# Patient Record
Sex: Female | Born: 1975
Health system: Southern US, Community
[De-identification: ages and names within clinical notes are randomized; demographics above are authoritative.]

## PROBLEM LIST (undated history)

## (undated) DIAGNOSIS — F329 Major depressive disorder, single episode, unspecified: Secondary | ICD-10-CM

## (undated) DIAGNOSIS — L509 Urticaria, unspecified: Secondary | ICD-10-CM

## (undated) DIAGNOSIS — F32A Depression, unspecified: Secondary | ICD-10-CM

## (undated) DIAGNOSIS — T7840XA Allergy, unspecified, initial encounter: Secondary | ICD-10-CM

## (undated) HISTORY — DX: Major depressive disorder, single episode, unspecified: F32.9

## (undated) HISTORY — DX: Depression, unspecified: F32.A

## (undated) HISTORY — PX: DILATION AND CURETTAGE OF UTERUS: SHX78

## (undated) HISTORY — DX: Allergy, unspecified, initial encounter: T78.40XA

## (undated) HISTORY — DX: Urticaria, unspecified: L50.9

---

## 2013-02-26 ENCOUNTER — Other Ambulatory Visit (HOSPITAL_COMMUNITY)
Admission: RE | Admit: 2013-02-26 | Discharge: 2013-02-26 | Disposition: A | Discharge status: Home / Self Care | Payer: Self-pay | Source: Ambulatory Visit | Attending: Women's Health | Admitting: Women's Health

## 2013-02-26 ENCOUNTER — Ambulatory Visit (INDEPENDENT_AMBULATORY_CARE_PROVIDER_SITE_OTHER): Payer: BC Managed Care – PPO | Admitting: Women's Health

## 2013-02-26 ENCOUNTER — Encounter: Payer: Self-pay | Admitting: Women's Health

## 2013-02-26 VITALS — BP 124/70 | Ht 64.5 in | Wt 162.0 lb

## 2013-02-26 DIAGNOSIS — N898 Other specified noninflammatory disorders of vagina: Secondary | ICD-10-CM

## 2013-02-26 DIAGNOSIS — Z113 Encounter for screening for infections with a predominantly sexual mode of transmission: Secondary | ICD-10-CM | POA: Insufficient documentation

## 2013-02-26 DIAGNOSIS — Z01419 Encounter for gynecological examination (general) (routine) without abnormal findings: Secondary | ICD-10-CM

## 2013-02-26 DIAGNOSIS — B9689 Other specified bacterial agents as the cause of diseases classified elsewhere: Secondary | ICD-10-CM

## 2013-02-26 DIAGNOSIS — N760 Acute vaginitis: Secondary | ICD-10-CM

## 2013-02-26 DIAGNOSIS — A499 Bacterial infection, unspecified: Secondary | ICD-10-CM

## 2013-02-26 LAB — WET PREP FOR TRICH, YEAST, CLUE: Trich, Wet Prep: NONE SEEN

## 2013-02-26 MED ORDER — METRONIDAZOLE 0.75 % VA GEL: VAGINAL | Status: DC

## 2013-02-26 NOTE — Patient Instructions (Signed)
Bacterial Vaginosis Bacterial vaginosis (BV) is a vaginal infection where the normal balance of bacteria in the vagina is disrupted. The normal balance is then replaced by an overgrowth of certain bacteria. There are several different kinds of bacteria that can cause BV. BV is the most common vaginal infection in women of childbearing age. CAUSES   The cause of BV is not fully understood. BV develops when there is an increase or imbalance of harmful bacteria.  Some activities or behaviors can upset the normal balance of bacteria in the vagina and put women at increased risk including:  Having a new sex partner or multiple sex partners.  Douching.  Using an intrauterine device (IUD) for contraception.  It is not clear what role sexual activity plays in the development of BV. However, women that have never had sexual intercourse are rarely infected with BV. Women do not get BV from toilet seats, bedding, swimming pools or from touching objects around them.  SYMPTOMS   Grey vaginal discharge.  A fish-like odor with discharge, especially after sexual intercourse.  Itching or burning of the vagina and vulva.  Burning or pain with urination.  Some women have no signs or symptoms at all. DIAGNOSIS  Your caregiver must examine the vagina for signs of BV. Your caregiver will perform lab tests and look at the sample of vaginal fluid through a microscope. They will look for bacteria and abnormal cells (clue cells), a pH test higher than 4.5, and a positive amine test all associated with BV.  RISKS AND COMPLICATIONS   Pelvic inflammatory disease (PID).  Infections following gynecology surgery.  Developing HIV.  Developing herpes virus. TREATMENT  Sometimes BV will clear up without treatment. However, all women with symptoms of BV should be treated to avoid complications, especially if gynecology surgery is planned. Female partners generally do not need to be treated. However, BV may spread  between female sex partners so treatment is helpful in preventing a recurrence of BV.   BV may be treated with antibiotics. The antibiotics come in either pill or vaginal cream forms. Either can be used with nonpregnant or pregnant women, but the recommended dosages differ. These antibiotics are not harmful to the baby.  BV can recur after treatment. If this happens, a second round of antibiotics will often be prescribed.  Treatment is important for pregnant women. If not treated, BV can cause a premature delivery, especially for a pregnant woman who had a premature birth in the past. All pregnant women who have symptoms of BV should be checked and treated.  For chronic reoccurrence of BV, treatment with a type of prescribed gel vaginally twice a week is helpful. HOME CARE INSTRUCTIONS   Finish all medication as directed by your caregiver.  Do not have sex until treatment is completed.  Tell your sexual partner that you have a vaginal infection. They should see their caregiver and be treated if they have problems, such as a mild rash or itching.  Practice safe sex. Use condoms. Only have 1 sex partner. PREVENTION  Basic prevention steps can help reduce the risk of upsetting the natural balance of bacteria in the vagina and developing BV:  Do not have sexual intercourse (be abstinent).  Do not douche.  Use all of the medicine prescribed for treatment of BV, even if the signs and symptoms go away.  Tell your sex partner if you have BV. That way, they can be treated, if needed, to prevent reoccurrence. SEEK MEDICAL CARE IF:     Your symptoms are not improving after 3 days of treatment.  You have increased discharge, pain, or fever. MAKE SURE YOU:   Understand these instructions.  Will watch your condition.  Will get help right away if you are not doing well or get worse. FOR MORE INFORMATION  Division of STD Prevention (DSTDP), Centers for Disease Control and Prevention:  www.cdc.gov/std American Social Health Association (ASHA): www.ashastd.org  Document Released: 09/09/2005 Document Revised: 12/02/2011 Document Reviewed: 03/02/2009 ExitCare Patient Information 2014 [employer].  

## 2013-02-26 NOTE — Progress Notes (Signed)
Monique Hurst 08-07-1976 [id number]    History:    New patient presents for annual exam with complaint of vaginal discharge. Had a negative STD screen in January, rare intercourse with husband, new partner one year. History of infertility using no contraception. Reports normal Pap history. Originally from [place].    Past medical history, past surgical history, family history and social history were all reviewed and documented in the EPIC chart. Moved here- husband working on PhD family counseling/ministry. 38 year old son. Geologist, engineering.  ROS:  A  ROS was performed and pertinent positives and negatives are included in the history.  Exam:  Filed Vitals:   02/26/13 1600  BP: 124/70    General appearance:  Normal Head/Neck:  Normal, without cervical or supraclavicular adenopathy. Thyroid:  Symmetrical, normal in size, without palpable masses or nodularity. Respiratory  Effort:  Normal  Auscultation:  Clear without wheezing or rhonchi Cardiovascular  Auscultation:  Regular rate, without rubs, murmurs or gallops  Edema/varicosities:  Not grossly evident Abdominal  Soft,nontender, without masses, guarding or rebound.  Liver/spleen:  No organomegaly noted  Hernia:  None appreciated  Skin  Inspection:  Grossly normal  Palpation:  Grossly normal Neurologic/psychiatric  Orientation:  Normal with appropriate conversation.  Mood/affect:  Normal  Genitourinary    Breasts: Examined lying and sitting.     Right: Without masses, retractions, discharge or axillary adenopathy.     Left: Without masses, retractions, discharge or axillary adenopathy.   Inguinal/mons:  Normal without inguinal adenopathy  External genitalia:  Normal  BUS/Urethra/Skene's glands:  Normal  Bladder:  Normal  Vagina:  Foul bloody discharge positive for amines, clues, and TNTC bacteria.  Cervix:  Normal  Uterus:  normal in size, shape and contour.  Midline and mobile  Adnexa/parametria:     Rt: Without masses  or tenderness.   Lt: Without masses or tenderness.  Anus and perineum: Normal  Digital rectal exam: Normal sphincter tone without palpated masses or tenderness  Assessment/Plan:  37 y.o. M. WF G2 P1  for annual exam.     Bacteria vaginosis  Plan: Encouraged to continue counseling due to marital issues. Pap, GC/Chlamydia added to Pap. SBE's, continue regular exercise/runner, calcium rich diet, MVI daily encouraged. Declines need for contraception. MetroGel vaginal cream 1 applicator at bedtime x5, alcohol precautions reviewed. Has also had some low abdominal discomfort, cramps if discomfort persists after treatment instructed to call back.   Harrington Challenger [facility], 4:34 PM 02/26/2013

## 2013-03-12 ENCOUNTER — Telehealth: Payer: Self-pay | Admitting: *Deleted

## 2013-03-12 NOTE — Telephone Encounter (Signed)
Pt informed with the below note. 

## 2013-03-12 NOTE — Telephone Encounter (Signed)
Pt was seen on 02/26/13 treated for BV infection, noticed that she is having frequent urination, very dark yellow urine with discomfort will using the restroom. OV? Please advise

## 2013-03-12 NOTE — Telephone Encounter (Signed)
Please call, best if she is seen for a UA. May be a UTI or a vaginal infection.

## 2013-03-15 ENCOUNTER — Encounter: Payer: Self-pay | Admitting: Gynecology

## 2013-03-15 ENCOUNTER — Ambulatory Visit (INDEPENDENT_AMBULATORY_CARE_PROVIDER_SITE_OTHER): Payer: BC Managed Care – PPO | Admitting: Women's Health

## 2013-03-15 ENCOUNTER — Encounter: Payer: Self-pay | Admitting: Women's Health

## 2013-03-15 DIAGNOSIS — R350 Frequency of micturition: Secondary | ICD-10-CM

## 2013-03-15 DIAGNOSIS — N390 Urinary tract infection, site not specified: Secondary | ICD-10-CM

## 2013-03-15 DIAGNOSIS — N949 Unspecified condition associated with female genital organs and menstrual cycle: Secondary | ICD-10-CM

## 2013-03-15 DIAGNOSIS — N9489 Other specified conditions associated with female genital organs and menstrual cycle: Secondary | ICD-10-CM

## 2013-03-15 LAB — URINALYSIS W MICROSCOPIC + REFLEX CULTURE
Protein, ur: NEGATIVE mg/dL
Urobilinogen, UA: 0.2 mg/dL (ref 0.0–1.0)

## 2013-03-15 LAB — WET PREP FOR TRICH, YEAST, CLUE
Clue Cells Wet Prep HPF POC: NONE SEEN
Trich, Wet Prep: NONE SEEN

## 2013-03-15 MED ORDER — SULFAMETHOXAZOLE-TRIMETHOPRIM 800-160 MG PO TABS: 1.0000 | ORAL_TABLET | Freq: Two times a day (BID) | ORAL | Status: DC

## 2013-03-15 MED ORDER — FLUCONAZOLE 150 MG PO TABS: 150.0000 mg | ORAL_TABLET | Freq: Once | ORAL | Status: DC

## 2013-03-15 NOTE — Patient Instructions (Addendum)
Asymptomatic Bacteriuria, Female Your urine study shows bacteria in your urine. You do not have the usual symptoms of burning or frequent urination. This is why it is called asymptomatic. You may need treatment with antibiotics. Treatment is especially important if you are pregnant. Sometimes this condition can progress to a more severe bladder or kidney infection. Symptoms include burning when urinating, back pain, fever, nausea, or vomiting. Take your antibiotics as directed. Finish them even if you start to feel better. Drink enough water and fluids to keep your urine clear or pale yellow. Go to the bathroom more frequently to keep your bladder empty. Keep the area around the vagina and rectum clean. Wipe yourself from front to back after urinating. Call your caregiver to arrange for follow-up care.  SEEK IMMEDIATE MEDICAL CARE IF:  You develop repeated vomiting.  You develop severe back or abdominal pain.  You have abnormal vaginal discharge or bleeding.  You have blood in the urine.  You develop cramping or abdominal pain.  You have a fever. If you are pregnant and develop any of the above problems see your caregiver or seek care immediately. Document Released: 09/09/2005 Document Revised: 12/02/2011 Document Reviewed: 07/26/2009 ExitCare Patient Information 2014 [employer].  

## 2013-03-15 NOTE — Progress Notes (Signed)
Patient ID: Monique Hurst, female   DOB: [birth year], 37 y.o.   MRN: [id number] Presents with complaint of increased urinary frequency with urgency, some pain and burning with urination. Questions if she still has bacteria vaginosis, slight vaginal burning. Has vague low abdominal/pelvic pressure  discomfort intermittently for the last 2 months. Denies fever, discharge. Monthly cycle.  Exam: Appears well, abdomen soft with no rebound or radiation of pain, no CVAT, external genitalia within normal limits, speculum exam scant discharge no erythema or odor noted, wet prep: negative. Bimanual no CMT or adnexal tenderness, mild tenderness in suprapubic area. UA: Small blood, small leukocytes, 21-50 WBCs, 11-20 rbc's.  UTI  Plan: Septra DS twice daily for 3 days #6, instructed to call if no relief of symptoms. If low abdominal pelvic pressure/discomfort continues will check ultrasound. Urine culture pending. Diflucan 150 times one dose given if vaginal itching occurs after antibiotic.

## 2013-03-16 ENCOUNTER — Telehealth: Payer: Self-pay | Admitting: *Deleted

## 2013-03-16 DIAGNOSIS — N949 Unspecified condition associated with female genital organs and menstrual cycle: Secondary | ICD-10-CM

## 2013-03-16 LAB — URINE CULTURE

## 2013-03-16 MED ORDER — FLUCONAZOLE 150 MG PO TABS: 150.0000 mg | ORAL_TABLET | Freq: Once | ORAL | Status: DC

## 2013-03-16 NOTE — Telephone Encounter (Signed)
Pt was seen yesterday and her diflucan tablet was sent to wrong pharmacy. It should have been sent to [facility]. I will sent rx there and call [facility] to cancel rx.

## 2013-03-17 ENCOUNTER — Telehealth: Payer: Self-pay | Admitting: *Deleted

## 2013-03-17 ENCOUNTER — Other Ambulatory Visit: Payer: Self-pay | Admitting: Women's Health

## 2013-03-17 DIAGNOSIS — R3129 Other microscopic hematuria: Secondary | ICD-10-CM

## 2013-03-17 NOTE — Telephone Encounter (Signed)
Pt was told her urine test was negative on 03/17/13 she is taking 3 days of Septra, no burning, but still having some stomach discomfort that makes her feel sick. Please advise

## 2013-03-17 NOTE — Telephone Encounter (Signed)
Left message on voicemail with the below.

## 2013-03-17 NOTE — Telephone Encounter (Signed)
Please call and have her schedule appointment with ultrasound after her next cycle.

## 2013-03-22 ENCOUNTER — Other Ambulatory Visit: Payer: Self-pay | Admitting: Women's Health

## 2013-03-22 DIAGNOSIS — M25559 Pain in unspecified hip: Secondary | ICD-10-CM

## 2013-04-02 ENCOUNTER — Other Ambulatory Visit: Payer: BC Managed Care – PPO

## 2013-04-02 ENCOUNTER — Ambulatory Visit: Payer: BC Managed Care – PPO | Admitting: Women's Health

## 2013-09-03 ENCOUNTER — Ambulatory Visit (INDEPENDENT_AMBULATORY_CARE_PROVIDER_SITE_OTHER): Payer: BC Managed Care – PPO | Admitting: Women's Health

## 2013-09-03 ENCOUNTER — Encounter: Payer: Self-pay | Admitting: Women's Health

## 2013-09-03 ENCOUNTER — Telehealth: Payer: Self-pay | Admitting: *Deleted

## 2013-09-03 DIAGNOSIS — A499 Bacterial infection, unspecified: Secondary | ICD-10-CM

## 2013-09-03 DIAGNOSIS — N949 Unspecified condition associated with female genital organs and menstrual cycle: Secondary | ICD-10-CM

## 2013-09-03 DIAGNOSIS — N9489 Other specified conditions associated with female genital organs and menstrual cycle: Secondary | ICD-10-CM

## 2013-09-03 DIAGNOSIS — N898 Other specified noninflammatory disorders of vagina: Secondary | ICD-10-CM

## 2013-09-03 DIAGNOSIS — B9689 Other specified bacterial agents as the cause of diseases classified elsewhere: Secondary | ICD-10-CM

## 2013-09-03 DIAGNOSIS — N760 Acute vaginitis: Secondary | ICD-10-CM

## 2013-09-03 LAB — WET PREP FOR TRICH, YEAST, CLUE: Trich, Wet Prep: NONE SEEN

## 2013-09-03 MED ORDER — FLUCONAZOLE 150 MG PO TABS: ORAL_TABLET | ORAL | Status: DC

## 2013-09-03 MED ORDER — METRONIDAZOLE 0.75 % VA GEL: VAGINAL | Status: DC

## 2013-09-03 NOTE — Telephone Encounter (Signed)
Pt informed with the below note, she will be in at 4:30 am

## 2013-09-03 NOTE — Progress Notes (Signed)
Patient ID: Monique Hurst, female   DOB: Oct 15, 1975, 37 y.o.   MRN: [id number] Presents with complaint of vaginal swelling and discharge with irritation and itching. Monthly cycle/condoms/same partner. Has had several bacterial and yeast infections this past year. Denies urinary symptoms.  Exam: Appears well. External genitalia extremely erythematous, speculum exam moderate amount of adherent white discharge with no odor noted, vaginal walls are erythematous. Wet prep positive for yeast, clues, and TNTC bacteria.  Bacteria vaginosis and Yeast  Plan: MetroGel vaginal cream 1 applicator at bedtime x5, alcohol precautions reviewed. Diflucan 150 by mouth today and repeat in 3 days. Instructed to call if no relief of symptoms. Boric acid to TWICE weekly vaginally after symptoms resolve. Instructed to call if  symptoms return.

## 2013-09-03 NOTE — Telephone Encounter (Signed)
4;30 pm

## 2013-09-03 NOTE — Patient Instructions (Signed)
metrogel vaginally for 5 nights,  Diflucan today and repeat in 3 days.  Boric acid gelcaps twice weekly after symptoms resolve Bacterial Vaginosis Bacterial vaginosis is an infection of the vagina. A healthy vagina has many kinds of good germs (bacteria). Sometimes the number of good germs can change. This allows bad germs to move in and cause an infection. You may be given medicine (antibiotics) to treat the infection. Or, you may not need treatment at all. HOME CARE  Take your medicine as told. Finish them even if you start to feel better.  Do not have sex until you finish your medicine.  Do not douche.  Practice safe sex.  Tell your sex partner that you have an infection. They should see their doctor for treatment if they have problems. GET HELP RIGHT AWAY IF:  You do not get better after 3 days of treatment.  You have grey fluid (discharge) coming from your vagina.  You have pain.  You have a temperature of 102 F (38.9 C) or higher. MAKE SURE YOU:   Understand these instructions.  Will watch your condition.  Will get help right away if you are not doing well or get worse. Document Released: 06/18/2008 Document Revised: 12/02/2011 Document Reviewed: 04/21/2013 [facility] Patient Information 2014 [place], [place].

## 2013-09-03 NOTE — Telephone Encounter (Signed)
OV

## 2013-09-03 NOTE — Telephone Encounter (Signed)
Pt calling c/o BV infection irritation and some odor yellowish discharge since last weekend. Pt has been unfaithful with husband but states they have not had intercourse in 7 month. Is not using condoms with current partner and using sex toys. I offered OV, but patient lives in [place] unable come today at times given by front desk. Pt call back # [phone]  Please advise

## 2014-01-04 ENCOUNTER — Other Ambulatory Visit: Payer: Self-pay

## 2014-01-04 DIAGNOSIS — B9689 Other specified bacterial agents as the cause of diseases classified elsewhere: Secondary | ICD-10-CM

## 2014-01-04 DIAGNOSIS — N760 Acute vaginitis: Principal | ICD-10-CM

## 2014-04-07 ENCOUNTER — Ambulatory Visit (INDEPENDENT_AMBULATORY_CARE_PROVIDER_SITE_OTHER): Payer: BC Managed Care – PPO | Admitting: Women's Health

## 2014-04-07 ENCOUNTER — Encounter: Payer: Self-pay | Admitting: Women's Health

## 2014-04-07 DIAGNOSIS — B373 Candidiasis of vulva and vagina: Secondary | ICD-10-CM

## 2014-04-07 DIAGNOSIS — R350 Frequency of micturition: Secondary | ICD-10-CM

## 2014-04-07 DIAGNOSIS — B3731 Acute candidiasis of vulva and vagina: Secondary | ICD-10-CM

## 2014-04-07 LAB — URINALYSIS W MICROSCOPIC + REFLEX CULTURE
Bilirubin Urine: NEGATIVE
CASTS: NONE SEEN
Crystals: NONE SEEN
GLUCOSE, UA: NEGATIVE mg/dL
HGB URINE DIPSTICK: NEGATIVE
Nitrite: NEGATIVE
PH: 7 (ref 5.0–8.0)
Protein, ur: NEGATIVE mg/dL
RBC / HPF: NONE SEEN RBC/hpf (ref <3)
Specific Gravity, Urine: 1.015 (ref 1.005–1.030)
Urobilinogen, UA: 0.2 mg/dL (ref 0.0–1.0)

## 2014-04-07 LAB — WET PREP FOR TRICH, YEAST, CLUE
Clue Cells Wet Prep HPF POC: NONE SEEN
Trich, Wet Prep: NONE SEEN

## 2014-04-07 MED ORDER — FLUCONAZOLE 150 MG PO TABS: 150.0000 mg | ORAL_TABLET | Freq: Once | ORAL | Status: DC

## 2014-04-07 NOTE — Patient Instructions (Signed)

## 2014-04-07 NOTE — Progress Notes (Signed)
Patient ID: Monique Hurst, female   DOB: 21-May-1976, 38 y.o.   MRN: [id number] Presents with complaint of vaginal irritation with itching for the last several days. Also reviewed labs 03/17/14 that she had a primary care. UA- treated for a UTI  with Cipro .CBC, thyroid panel, comprehensive metabolic panel all normal.  Exam: Appears well. External genitalia extremely erythematous, speculum exam moderate amount curdy  white discharge, wet prep positive for yeast.  Yeast vaginitis  Plan: Diflucan 1:50 po x1 days with 1 refill. UA trace leukocytes, culture pending for test of cure. Yeast and UTI prevention discussed will call if no relief of symptoms. Keep scheduled annual exam in August.

## 2014-04-08 LAB — URINE CULTURE: Colony Count: 2000

## 2014-04-11 ENCOUNTER — Telehealth: Payer: Self-pay | Admitting: *Deleted

## 2014-04-11 NOTE — Telephone Encounter (Signed)
Pt was seen on 04/07/14 treated for yeast infection took 1 dose of diflucan 150 mg. Still having symptoms, pt had extra refill never took , will take and follow up as needed.

## 2014-04-28 ENCOUNTER — Encounter: Payer: Self-pay | Admitting: Women's Health

## 2014-04-28 ENCOUNTER — Ambulatory Visit (INDEPENDENT_AMBULATORY_CARE_PROVIDER_SITE_OTHER): Payer: BC Managed Care – PPO | Admitting: Women's Health

## 2014-04-28 ENCOUNTER — Other Ambulatory Visit (HOSPITAL_COMMUNITY)
Admission: RE | Admit: 2014-04-28 | Discharge: 2014-04-28 | Disposition: A | Discharge status: Home / Self Care | Payer: BC Managed Care – PPO | Source: Ambulatory Visit | Attending: Gynecology | Admitting: Gynecology

## 2014-04-28 VITALS — BP 120/80 | Ht 65.5 in | Wt 179.8 lb

## 2014-04-28 DIAGNOSIS — E079 Disorder of thyroid, unspecified: Secondary | ICD-10-CM

## 2014-04-28 DIAGNOSIS — Z13220 Encounter for screening for lipoid disorders: Secondary | ICD-10-CM

## 2014-04-28 DIAGNOSIS — Z01419 Encounter for gynecological examination (general) (routine) without abnormal findings: Secondary | ICD-10-CM

## 2014-04-28 DIAGNOSIS — Z833 Family history of diabetes mellitus: Secondary | ICD-10-CM

## 2014-04-28 NOTE — Progress Notes (Signed)
Monique Hurst 1976/07/30 [id number]    History:    Presents for annual exam.  Regular monthly cycles/vasectomy. Normal Pap history.  Past medical history, past surgical history, family history and social history were all reviewed and documented in the EPIC chart. Paralegal, , working as a Control and instrumentation engineer. Has a 39 year old son doing well. Originally from [place].  ROS:  A  12 point ROS was performed and pertinent positives and negatives are included.  Exam:  Filed Vitals:   04/28/14 1023  BP: 120/80    General appearance:  Normal Thyroid:  Symmetrical, normal in size, without palpable masses or nodularity. Respiratory  Auscultation:  Clear without wheezing or rhonchi Cardiovascular  Auscultation:  Regular rate, without rubs, murmurs or gallops  Edema/varicosities:  Not grossly evident Abdominal  Soft,nontender, without masses, guarding or rebound.  Liver/spleen:  No organomegaly noted  Hernia:  None appreciated  Skin  Inspection:  Grossly normal   Breasts: Examined lying and sitting.     Right: Without masses, retractions, discharge or axillary adenopathy.     Left: Without masses, retractions, discharge or axillary adenopathy. Gentitourinary   Inguinal/mons:  Normal without inguinal adenopathy  External genitalia:  Normal  BUS/Urethra/Skene's glands:  Normal  Vagina:  Normal  Cervix:  Normal  Uterus:   normal in size, shape and contour.  Midline and mobile  Adnexa/parametria:     Rt: Without masses or tenderness.   Lt: Without masses or tenderness.  Anus and perineum: Normal  Digital rectal exam: Normal sphincter tone without palpated masses or tenderness  Assessment/Plan:  38 y.o.MWF G1P1  for annual exam with no complaints.  Normal GYN exam/vasectomy  Plan SBE's, annual mammogram at 39, continue regular exercise, calcium rich diet, decrease calories for weight loss. CBC, lipid panel, glucose, UA, Pap Pap normal 2014 absent endocervical cells.   Note:  This dictation was prepared with Dragon/digital dictation.  Any transcriptional errors that result are unintentional. Huel Cote [facility], 1:53 PM 04/28/2014

## 2014-04-28 NOTE — Patient Instructions (Signed)

## 2014-04-29 LAB — URINALYSIS W MICROSCOPIC + REFLEX CULTURE
BILIRUBIN URINE: NEGATIVE
CRYSTALS: NONE SEEN
Casts: NONE SEEN
GLUCOSE, UA: NEGATIVE mg/dL
Hgb urine dipstick: NEGATIVE
Ketones, ur: NEGATIVE mg/dL
Leukocytes, UA: NEGATIVE
Nitrite: NEGATIVE
Protein, ur: NEGATIVE mg/dL
SPECIFIC GRAVITY, URINE: 1.018 (ref 1.005–1.030)
UROBILINOGEN UA: 0.2 mg/dL (ref 0.0–1.0)
pH: 6 (ref 5.0–8.0)

## 2014-05-02 LAB — CYTOLOGY - PAP

## 2014-07-25 ENCOUNTER — Encounter: Payer: Self-pay | Admitting: Women's Health

## 2014-11-21 ENCOUNTER — Ambulatory Visit (INDEPENDENT_AMBULATORY_CARE_PROVIDER_SITE_OTHER): Payer: BLUE CROSS/BLUE SHIELD | Admitting: Gynecology

## 2014-11-21 ENCOUNTER — Encounter: Payer: Self-pay | Admitting: Gynecology

## 2014-11-21 VITALS — BP 116/74

## 2014-11-21 DIAGNOSIS — N946 Dysmenorrhea, unspecified: Secondary | ICD-10-CM

## 2014-11-21 DIAGNOSIS — N898 Other specified noninflammatory disorders of vagina: Secondary | ICD-10-CM

## 2014-11-21 LAB — WET PREP FOR TRICH, YEAST, CLUE
CLUE CELLS WET PREP: NONE SEEN
TRICH WET PREP: NONE SEEN

## 2014-11-21 MED ORDER — BETAMETHASONE DIPROPIONATE 0.05 % EX CREA: TOPICAL_CREAM | Freq: Two times a day (BID) | CUTANEOUS | Status: DC

## 2014-11-21 MED ORDER — FLUCONAZOLE 200 MG PO TABS: 200.0000 mg | ORAL_TABLET | Freq: Every day | ORAL | Status: DC

## 2014-11-21 NOTE — Patient Instructions (Signed)
Follow up for the ultrasound as scheduled.  Take the Diflucan pill daily for 5 days  Apply the steroid cream externally twice daily.  Follow up if symptoms persist or worsen.

## 2014-11-21 NOTE — Progress Notes (Signed)
Monique Hurst 03/17/1976 [id number]        39 y.o.  G2P0011 presents with onset of vaginal irritation 5 days ago. Slight discharge. Had bikini waxing and subsequent intercourse. Now with significant irritation. Mild discharge. No odor. No fever chills nausea vomiting diarrhea constipation. No frequency dysuria or urgency. Patient also complaining of worsening dysmenorrhea with each menses. Notes starts right before her menses and lasts for several days. Totally resolves in between. Notes some deep dyspareunia but not consistent. Regular menses without breakthrough bleeding. Vasectomy birth control.  Past medical history,surgical history, problem list, medications, allergies, family history and social history were all reviewed and documented in the EPIC chart.  Directed ROS with pertinent positives and negatives documented in the history of present illness/assessment and plan.  Exam:  Kim assistant General appearance:  Normal Abdomen soft nontender without masses guarding rebound Pelvic external BUS vagina with intense symmetrical irritative vulvitis from periclitoral region to perianal region. Heavy white discharge. Cervix normal. Uterus normal size midline mobile nontender. Adnexa without masses or tenderness.  Assessment/Plan:  39 y.o. G2P0011 with intense vulvitis. Wet prep is positive for yeast.  I suspect she may have also had an allergic reaction to the waxing as it seems to be a more intense response than normal yeast. Will cover with Diflucan 200 mg daily 5 days. Diprolene 0.05% cream externally twice a day. Follow up if symptoms persist or worsen. Also schedule ultrasound due to her dysmenorrhea. Differential to include endometriosis reviewed. Possible laparoscopy also discussed. Options to include nonsteroidal anti-inflammatory perimenstrual discussed. Will further review after ultrasound.     Anastasio Auerbach MD, 10:14 AM 11/21/2014

## 2014-11-30 ENCOUNTER — Other Ambulatory Visit: Payer: Self-pay | Admitting: Gynecology

## 2014-11-30 ENCOUNTER — Ambulatory Visit (INDEPENDENT_AMBULATORY_CARE_PROVIDER_SITE_OTHER): Payer: BLUE CROSS/BLUE SHIELD | Admitting: Gynecology

## 2014-11-30 ENCOUNTER — Telehealth: Payer: Self-pay | Admitting: *Deleted

## 2014-11-30 ENCOUNTER — Ambulatory Visit (INDEPENDENT_AMBULATORY_CARE_PROVIDER_SITE_OTHER): Payer: BLUE CROSS/BLUE SHIELD

## 2014-11-30 ENCOUNTER — Encounter: Payer: Self-pay | Admitting: Gynecology

## 2014-11-30 VITALS — BP 112/66

## 2014-11-30 DIAGNOSIS — N946 Dysmenorrhea, unspecified: Secondary | ICD-10-CM | POA: Diagnosis not present

## 2014-11-30 DIAGNOSIS — K625 Hemorrhage of anus and rectum: Secondary | ICD-10-CM

## 2014-11-30 DIAGNOSIS — D251 Intramural leiomyoma of uterus: Secondary | ICD-10-CM

## 2014-11-30 MED ORDER — IBUPROFEN 800 MG PO TABS: 800.0000 mg | ORAL_TABLET | Freq: Three times a day (TID) | ORAL | Status: AC | PRN

## 2014-11-30 NOTE — Telephone Encounter (Signed)
Referral placed they will contact pt to schedule. 

## 2014-11-30 NOTE — Telephone Encounter (Signed)
-----   Message from Anastasio Auerbach, MD sent at 11/30/2014 10:54 AM EST ----- Schedule an appointment with [facility] reference new onset rectal bleeding

## 2014-11-30 NOTE — Progress Notes (Signed)
Monique Hurst April 23, 1976 [id number]        39 y.o.  G2P0011 presents for ultrasound due to worsening dysmenorrhea and intermittent dyspareunia. Exam was normal. Did mention to the ultrasound technician that she was having intermittent rectal bleeding. No diarrhea constipation lower abdominal pain.  Past medical history,surgical history, problem list, medications, allergies, family history and social history were all reviewed and documented in the EPIC chart.  Directed ROS with pertinent positives and negatives documented in the history of present illness/assessment and plan.  Ultrasound shows uterus normal size with 2 small myomas 31 mm and 32 mm. Endometrial echo 8.2 mm. Right and left ovaries normal with physiologic changes. Cul-de-sac negative.  Assessment/Plan:  39 y.o. G2P0011 with worsening dysmenorrhea. Intermittent deep dyspareunia but not consistent. Ultrasound is normal. Reviewed with the patient options to include laparoscopy now to rule out endometriosis versus trial of nonsteroidal anti-inflammatory such as ibuprofen 800 mg every 8 hours earliest onset of cramping times several days #60 refill 1. Patient will try the ibuprofen. She'll follow up if she has worsening dysmenorrhea or other symptoms and she wants to proceed with laparoscopy. I reviewed the history of her rectal bleeding. Recommended follow up with gastroenterology and we will help to arrange this appointment and she knows the importance of follow up and to expect our phone call to arrange.     Anastasio Auerbach MD, 10:57 AM 11/30/2014

## 2014-11-30 NOTE — Patient Instructions (Signed)
Office will contact you to arrange the gastroenterology appointment.  Follow up with me if you are interested in pursuing the laparoscopy.

## 2014-12-01 ENCOUNTER — Encounter: Payer: Self-pay | Admitting: Physician Assistant

## 2014-12-02 NOTE — Telephone Encounter (Signed)
appointment 12/14/14 @ 8:30am with Dr.Esterwood.

## 2014-12-14 ENCOUNTER — Ambulatory Visit: Payer: Self-pay | Admitting: Physician Assistant

## 2014-12-27 ENCOUNTER — Ambulatory Visit (INDEPENDENT_AMBULATORY_CARE_PROVIDER_SITE_OTHER): Payer: BLUE CROSS/BLUE SHIELD | Admitting: Physician Assistant

## 2014-12-27 ENCOUNTER — Telehealth: Payer: Self-pay | Admitting: *Deleted

## 2014-12-27 ENCOUNTER — Encounter: Payer: Self-pay | Admitting: Physician Assistant

## 2014-12-27 VITALS — BP 120/80 | HR 72 | Ht 66.0 in | Wt 181.0 lb

## 2014-12-27 DIAGNOSIS — K625 Hemorrhage of anus and rectum: Secondary | ICD-10-CM | POA: Diagnosis not present

## 2014-12-27 DIAGNOSIS — R1030 Lower abdominal pain, unspecified: Secondary | ICD-10-CM | POA: Diagnosis not present

## 2014-12-27 DIAGNOSIS — Z800 Family history of malignant neoplasm of digestive organs: Secondary | ICD-10-CM | POA: Diagnosis not present

## 2014-12-27 MED ORDER — MOVIPREP 100 G PO SOLR: 1.0000 | ORAL | Status: DC

## 2014-12-27 NOTE — Patient Instructions (Signed)
You have been scheduled for a colonoscopy. Please follow written instructions given to you at your visit today.  Please pick up your prep supplies at the pharmacy within the next 1-3 days. [place], [place], [place]. If you use inhalers (even only as needed), please bring them with you on the day of your procedure. Your physician has requested that you go to www.startemmi.com and enter the access code given to you at your visit today. This web site gives a general overview about your procedure. However, you should still follow specific instructions given to you by our office regarding your preparation for the procedure.

## 2014-12-27 NOTE — Telephone Encounter (Signed)
Received lab results from Dr. Nyra Capes, [place] per our request from signed release.  Gave to Amy Esterwood PA-C for review.

## 2014-12-27 NOTE — Progress Notes (Signed)
Patient ID: Monique Hurst, female   DOB: [birth year], 39 y.o.   MRN: [id number]   Subjective:    Patient ID: Monique Hurst, female    DOB: Sep 19, 1976, 39 y.o.   MRN: [id number]  HPI Monique Hurst is a 39 year old female new to GI, referred by Dr. Phineas Real for evaluation of rectal bleeding. Patient has undergone GYN evaluation for pelvic pain, dysmenorrhea and dyspareunia and per Dr. Zelphia Cairo notes may have endometriosis. She has not had laparoscopy. Patient states that she's been having her current symptoms for several months but towards the end of February had a 2 to three-week episode of rectal bleeding with bright red blood noted with every bowel movement. She says there was blood in the commode and mixed with her bowel movements and some very tiny clots. She says this was associated with lower abdominal cramping and it was hard for her to differentiate this discomfort from her menstrual pain. She believes that this episode happened around the time of flight menstrual cycle however is not certain. Bleeding stopped and then she had recurrence last week over 2 days with similar bright red blood with bowel movements. She shows she feels bloated full and uncomfortable in her lower abdomen all the time "like there is a fist in her lower abdomen". She's not had any change in her bowel habits other than perhaps looser stools appetite is been fine and weight has been stable and no upper GI symptoms. She's been taking ibuprofen periodically no not on a regular basis. No prior abdominal surgery no prior GI evaluation. Sister was just diagnosed with some sort of "intestinal cancer" at age 53 and is stage II -she says they are not  very close, is not exactly sure what she has. Paternal grandfather died from colon cancer.  Review of Systems Pertinent positive and negative review of systems were noted in the above HPI section.  All other review of systems was otherwise negative.  Outpatient Encounter Prescriptions as of  12/27/2014  Medication Sig  . ibuprofen (ADVIL,MOTRIN) 800 MG tablet Take 1 tablet (800 mg total) by mouth every 8 (eight) hours as needed.  Marland Kitchen PRESCRIPTION MEDICATION Rx for allergies  ? name  . MOVIPREP 100 G SOLR Take 1 kit (200 g total) by mouth as directed.   No Known Allergies Patient Active Problem List   Diagnosis Date Noted  . BV (bacterial vaginosis) 02/26/2013   History   Social History  . Marital Status: Married    Spouse Name: N/A  . Number of Children: N/A  . Years of Education: N/A   Occupational History  . Not on file.   Social History Main Topics  . Smoking status: Former Research scientist (life sciences)  . Smokeless tobacco: Never Used  . Alcohol Use: Yes     Comment: BEER  . Drug Use: No  . Sexual Activity: Yes   Other Topics Concern  . Not on file   Social History Narrative    Ms. Seward's family history includes Hypertension in her maternal grandmother.      Objective:    Filed Vitals:   12/27/14 1038  BP: 120/80  Pulse: 72    Physical Exam   well-developed young female in no acute distress blood pressure 120/80 pulse 72 height 5 foot 6 weight 181, BMI 29. HEENT ;nontraumatic normocephalic EOMI PERRLA sclera anicteric, Supple, Cardiovascular; regular rate and rhythm with S1-S2 no murmur or gallop, Pulmonary ;clear bilaterally, Abdomen; soft she has mild tenderness in the suprapubic and right lower quadrant no rebound  no palpable mass or hepatosplenomegaly, bowel sounds are present, Rectal; exam not done this was recently done by her PCP with finding of external hemorrhoid, Ext; is no clubbing cyanosis or edema skin warm and dry, Psych; mood and affect appropriate       Assessment & Plan:   #1 39 yo female with several month hx of lower abdominal pain, and 2 separate episodes of BRB per rectum -the first lasting for 2 weeks . Also has dysmenorrhea, dyspareunia. R/O occult colon lesion R/O possible endometriosis with bowel involvement #2 Family hx of colon cancer  -sibling  Plan; will obtain recent labs from her PCP Schedule for Colonoscopy with Dr. Scarlette Shorts. Procedure was discussed in detail with patient and she is agreeable to proceed Further workup pending results of above     Monique Hurst S Livian Vanderbeck PA-C 12/27/2014   Cc: No ref. provider found

## 2014-12-27 NOTE — Progress Notes (Signed)
Agree with assessment and plans 

## 2014-12-28 ENCOUNTER — Other Ambulatory Visit: Payer: Self-pay

## 2014-12-28 ENCOUNTER — Other Ambulatory Visit (INDEPENDENT_AMBULATORY_CARE_PROVIDER_SITE_OTHER): Payer: BLUE CROSS/BLUE SHIELD

## 2014-12-28 ENCOUNTER — Telehealth: Payer: Self-pay

## 2014-12-28 DIAGNOSIS — K625 Hemorrhage of anus and rectum: Secondary | ICD-10-CM

## 2014-12-28 LAB — CBC WITH DIFFERENTIAL/PLATELET
BASOS ABS: 0.1 10*3/uL (ref 0.0–0.1)
BASOS PCT: 0.7 % (ref 0.0–3.0)
Eosinophils Absolute: 0.4 10*3/uL (ref 0.0–0.7)
Eosinophils Relative: 3 % (ref 0.0–5.0)
HCT: 42.7 % (ref 36.0–46.0)
HEMOGLOBIN: 14.5 g/dL (ref 12.0–15.0)
LYMPHS PCT: 21.9 % (ref 12.0–46.0)
Lymphs Abs: 2.9 10*3/uL (ref 0.7–4.0)
MCHC: 34 g/dL (ref 30.0–36.0)
MCV: 88.3 fl (ref 78.0–100.0)
Monocytes Absolute: 0.7 10*3/uL (ref 0.1–1.0)
Monocytes Relative: 5.2 % (ref 3.0–12.0)
NEUTROS ABS: 9.1 10*3/uL — AB (ref 1.4–7.7)
Neutrophils Relative %: 69.2 % (ref 43.0–77.0)
Platelets: 290 10*3/uL (ref 150.0–400.0)
RBC: 4.84 Mil/uL (ref 3.87–5.11)
RDW: 13 % (ref 11.5–15.5)
WBC: 13.2 10*3/uL — ABNORMAL HIGH (ref 4.0–10.5)

## 2014-12-28 NOTE — Telephone Encounter (Signed)
-----   Message from Alfredia Ferguson, PA-C sent at 12/27/2014  5:00 PM EDT ----- I saw this pt today as a new pt for rectal bleeding- got records from PCP and no CBC had been done- please call pt ,let her know and ask he to come have a cbc done-thanks

## 2014-12-28 NOTE — Telephone Encounter (Signed)
I have left message for the patient to call back 

## 2014-12-28 NOTE — Telephone Encounter (Signed)
Patient agrees. She is surprised there was no CBC. She thought she had it done recently. Plans to come today.

## 2015-01-20 ENCOUNTER — Ambulatory Visit (AMBULATORY_SURGERY_CENTER): Payer: BLUE CROSS/BLUE SHIELD | Admitting: Internal Medicine

## 2015-01-20 ENCOUNTER — Encounter: Payer: Self-pay | Admitting: Internal Medicine

## 2015-01-20 VITALS — BP 124/72 | HR 59 | Temp 98.8°F | Resp 22 | Ht 66.0 in | Wt 181.0 lb

## 2015-01-20 DIAGNOSIS — R1030 Lower abdominal pain, unspecified: Secondary | ICD-10-CM

## 2015-01-20 DIAGNOSIS — K625 Hemorrhage of anus and rectum: Secondary | ICD-10-CM

## 2015-01-20 MED ORDER — SODIUM CHLORIDE 0.9 % IV SOLN: 500.0000 mL | INTRAVENOUS | Status: DC

## 2015-01-20 NOTE — Op Note (Signed)
[place]  [address] [place], [id number]   COLONOSCOPY PROCEDURE REPORT  PATIENT: Monique Hurst, Monique Hurst  MR#: [id number] BIRTHDATE: 1976-03-30 , 38  yrs. old GENDER: female ENDOSCOPIST: Eustace Quail, MD REFERRED SL:HTDSKAJ Fontaine, M.D. PROCEDURE DATE:  01/20/2015 PROCEDURE:   Colonoscopy, diagnostic First Screening Colonoscopy - Avg.  risk and is 50 yrs.  old or older - No.  Prior Negative Screening - Now for repeat screening. N/A  History of Adenoma - Now for follow-up colonoscopy & has been > or = to 3 yrs.  N/A ASA CLASS:   Class I INDICATIONS:Evaluation of unexplained GI bleeding and lower abdominal discomfort and Patient is not applicable for Colorectal Neoplasm Risk Assessment for this procedure. MEDICATIONS: Monitored anesthesia care and Propofol 270 mg IV  DESCRIPTION OF PROCEDURE:   After the risks benefits and alternatives of the procedure were thoroughly explained, informed consent was obtained.  The digital rectal exam revealed no abnormalities of the rectum.   The LB [id number]  endoscope was introduced through the anus and advanced to the cecum, which was identified by both the appendix and ileocecal valve. No adverse events experienced.   The quality of the prep was excellent. (MoviPrep was used)  The instrument was then slowly withdrawn as the colon was fully examined.   COLON FINDINGS: There was moderate diverticulosis noted in the sigmoid colon.   The examination was otherwise normal.   The examined terminal ileum appeared to be normal.  Retroflexed views revealed small noninflamed internal hemorrhoids. The time to cecum = 5.7 Withdrawal time = 7.0   The scope was withdrawn and the procedure completed. COMPLICATIONS: There were no immediate complications.  ENDOSCOPIC IMPRESSION: 1.   Moderate diverticulosis was noted in the sigmoid colon 2.   The examination was otherwise normal 3.   The examined terminal ileum appeared  normal  RECOMMENDATIONS: 1. Return to the care of Dr. Phineas Real  eSigned:  Eustace Quail, MD 01/20/2015 4:41 PM   cc: Donalynn Furlong, MD, Marco Collie, MD, and The Patient

## 2015-01-20 NOTE — Progress Notes (Signed)
Report to PACU, RN, vss, BBS= Clear.  

## 2015-01-20 NOTE — Patient Instructions (Signed)
YOU HAD AN ENDOSCOPIC PROCEDURE TODAY AT [facility]:   Refer to the procedure report that was given to you for any specific questions about what was found during the examination.  If the procedure report does not answer your questions, please call your gastroenterologist to clarify.  If you requested that your care partner not be given the details of your procedure findings, then the procedure report has been included in a sealed envelope for you to review at your convenience later.  YOU SHOULD EXPECT: Some feelings of bloating in the abdomen. Passage of more gas than usual.  Walking can help get rid of the air that was put into your GI tract during the procedure and reduce the bloating. If you had a lower endoscopy (such as a colonoscopy or flexible sigmoidoscopy) you may notice spotting of blood in your stool or on the toilet paper. If you underwent a bowel prep for your procedure, you may not have a normal bowel movement for a few days.  Please Note:  You might notice some irritation and congestion in your nose or some drainage.  This is from the oxygen used during your procedure.  There is no need for concern and it should clear up in a day or so.  SYMPTOMS TO REPORT IMMEDIATELY:   Following lower endoscopy (colonoscopy or flexible sigmoidoscopy):  Excessive amounts of blood in the stool  Significant tenderness or worsening of abdominal pains  Swelling of the abdomen that is new, acute  Fever of 100F or higher  For urgent or emergent issues, a gastroenterologist can be reached at any hour by calling [phone].   DIET: Your first meal following the procedure should be a small meal and then it is ok to progress to your normal diet. Heavy or fried foods are harder to digest and may make you feel nauseous or bloated.  Likewise, meals heavy in dairy and vegetables can increase bloating.  Drink plenty of fluids but you should avoid alcoholic beverages for 24  hours.  ACTIVITY:  You should plan to take it easy for the rest of today and you should NOT DRIVE or use heavy machinery until tomorrow (because of the sedation medicines used during the test).    FOLLOW UP: Our staff will call the number listed on your records the next business day following your procedure to check on you and address any questions or concerns that you may have regarding the information given to you following your procedure. If we do not reach you, we will leave a message.  However, if you are feeling well and you are not experiencing any problems, there is no need to return our call.  We will assume that you have returned to your regular daily activities without incident.  If any biopsies were taken you will be contacted by phone or by letter within the next 1-3 weeks.  Please call us at [phone] if you have not heard about the biopsies in 3 weeks.    SIGNATURES/CONFIDENTIALITY: You and/or your care partner have signed paperwork which will be entered into your electronic medical record.  These signatures attest to the fact that that the information above on your After Visit Summary has been reviewed and is understood.  Full responsibility of the confidentiality of this discharge information lies with you and/or your care-partner.  Diverticulosis and high fiber diet information given.

## 2015-01-23 ENCOUNTER — Telehealth: Payer: Self-pay

## 2015-01-23 NOTE — Telephone Encounter (Signed)
  Follow up Call-  Call back number 01/20/2015  Post procedure Call Back phone  # [phone]  Permission to leave phone message Yes     Patient questions:  Do you have a fever, pain , or abdominal swelling? No. Pain Score  0 *  Have you tolerated food without any problems? Yes.    Have you been able to return to your normal activities? Yes.    Do you have any questions about your discharge instructions: Diet   No. Medications  No. Follow up visit  No.  Do you have questions or concerns about your Care? No.  Actions: * If pain score is 4 or above: No action needed, pain <4.  No problems per the pt. maw

## 2015-07-05 DIAGNOSIS — Z872 Personal history of diseases of the skin and subcutaneous tissue: Secondary | ICD-10-CM | POA: Insufficient documentation

## 2015-07-05 DIAGNOSIS — L309 Dermatitis, unspecified: Secondary | ICD-10-CM | POA: Insufficient documentation

## 2015-12-08 ENCOUNTER — Encounter: Payer: Self-pay | Admitting: Women's Health

## 2015-12-08 ENCOUNTER — Ambulatory Visit (INDEPENDENT_AMBULATORY_CARE_PROVIDER_SITE_OTHER): Payer: BLUE CROSS/BLUE SHIELD | Admitting: Women's Health

## 2015-12-08 VITALS — BP 136/80 | Ht 66.0 in | Wt 181.0 lb

## 2015-12-08 DIAGNOSIS — N760 Acute vaginitis: Secondary | ICD-10-CM

## 2015-12-08 DIAGNOSIS — N926 Irregular menstruation, unspecified: Secondary | ICD-10-CM | POA: Diagnosis not present

## 2015-12-08 DIAGNOSIS — A499 Bacterial infection, unspecified: Secondary | ICD-10-CM

## 2015-12-08 DIAGNOSIS — N938 Other specified abnormal uterine and vaginal bleeding: Secondary | ICD-10-CM | POA: Diagnosis not present

## 2015-12-08 DIAGNOSIS — B9689 Other specified bacterial agents as the cause of diseases classified elsewhere: Secondary | ICD-10-CM

## 2015-12-08 LAB — WET PREP FOR TRICH, YEAST, CLUE
Trich, Wet Prep: NONE SEEN
Yeast Wet Prep HPF POC: NONE SEEN

## 2015-12-08 LAB — PREGNANCY, URINE: PREG TEST UR: NEGATIVE

## 2015-12-08 MED ORDER — METRONIDAZOLE 0.75 % VA GEL: VAGINAL | Status: DC

## 2015-12-08 MED ORDER — MEDROXYPROGESTERONE ACETATE 10 MG PO TABS: 10.0000 mg | ORAL_TABLET | Freq: Every day | ORAL | Status: DC

## 2015-12-08 NOTE — Patient Instructions (Addendum)
Dysfunctional Uterine Bleeding Dysfunctional uterine bleeding is abnormal bleeding from the uterus. Dysfunctional uterine bleeding includes:  A period that comes earlier or later than usual.  A period that is lighter, heavier, or has blood clots.  Bleeding between periods.  Skipping one or more periods.  Bleeding after sexual intercourse.  Bleeding after menopause. HOME CARE INSTRUCTIONS  Pay attention to any changes in your symptoms. Follow these instructions to help with your condition: Eating  Eat well-balanced meals. Include foods that are high in iron, such as liver, meat, shellfish, green leafy vegetables, and eggs.  If you become constipated:  Drink plenty of water.  Eat fruits and vegetables that are high in water and fiber, such as spinach, carrots, raspberries, apples, and mango. Medicines  Take over-the-counter and prescription medicines only as told by your health care provider.  Do not change medicines without talking with your health care provider.  Aspirin or medicines that contain aspirin may make the bleeding worse. Do not take those medicines:  During the week before your period.  During your period.  If you were prescribed iron pills, take them as told by your health care provider. Iron pills help to replace iron that your body loses because of this condition. Activity  If you need to change your sanitary pad or tampon more than one time every 2 hours:  Lie in bed with your feet raised (elevated).  Place a cold pack on your lower abdomen.  Rest as much as possible until the bleeding stops or slows down.  Do not try to lose weight until the bleeding has stopped and your blood iron level is back to normal. Other Instructions  For two months, write down:  When your period starts.  When your period ends.  When any abnormal bleeding occurs.  What problems you notice.  Keep all follow up visits as told by your health care provider. This is  important. SEEK MEDICAL CARE IF:  You get light-headed or weak.  You have nausea and vomiting.  You cannot eat or drink without vomiting.  You feel dizzy or have diarrhea while you are taking medicines.  You are taking birth control pills or hormones, and you want to change them or stop taking them. SEEK IMMEDIATE MEDICAL CARE IF:  You develop a fever or chills.  You need to change your sanitary pad or tampon more than one time per hour.  Your bleeding becomes heavier, or your flow contains clots more often.  You develop pain in your abdomen.  You lose consciousness.  You develop a rash.   This information is not intended to replace advice given to you by your health care provider. Make sure you discuss any questions you have with your health care provider.   Document Released: 09/06/2000 Document Revised: 05/31/2015 Document Reviewed: 12/05/2014 Elsevier Interactive Patient Education 2016 [place] is a procedure to examine the inside of your uterus. This exam uses sound waves sent to a computer to make real-time pictures of the inside of your uterus. To get the best images, a germ-free, saltwater solution (sterile saline) is injected into your uterus through your vagina. A sonohysterogram can show whether there is scarring or abnormal growths inside your uterus. It can also show whether your uterus is an abnormal shape or whether the lining is too thin.  LET [facility] CARE PROVIDER KNOW ABOUT:  Any allergies you have.  All medicines you are taking, including vitamins, herbs, eyedrops, creams, and over-the-counter medicines.  Previous problems you or members of your family have had with the use of anesthetics.  Any blood disorders you have.  Previous surgeries you have had.  Medical conditions you have.  The dates of your last period.  Possibility of pregnancy. RISKS AND COMPLICATIONS Generally, a sonohysterogram is a safe  procedure. However, as with any procedure, problems can occur. Possible problems include:  Bleeding.  Infection. BEFORE THE PROCEDURE  Your health care provider may have you take an over-the-counter pain medicine.  You may get a prescription for antibiotic medicine.  Your health care provider may give you a pregnancy test before the procedure.  You will empty your bladder. PROCEDURE   You will lie down on the examining table with your knees raised or your feet in stirrups.  Your health care provider may do a pelvic exam before starting the procedure.  A slender, handheld device (transducer) will be lubricated and placed into your vagina.  The transducer will be positioned to send sound waves to your uterus.  The sound waves will bounce back to the transducer. They will be sent to a computer.  The computer will turn the sound waves into live images.  Your health care provider will view the images on a screen during the procedure.  Your health care provider will remove the transducer from your vagina and use an instrument to widen the opening (speculum).  A swab will be used to clean the opening to your uterus (cervix).  A long, thin tube (catheter) will then be placed through your cervix into your uterus.  Your health care provider will fill your uterus with sterile saline through the catheter. You may feel some cramping.  The speculum will be removed.  The transducer will be placed back in your vagina to take more images. AFTER THE PROCEDURE After the procedure, it is typical to have light bleeding from your vagina, cramping, and watery vaginal discharge.    This information is not intended to replace advice given to you by your health care provider. Make sure you discuss any questions you have with your health care provider.   Document Released: 01/24/2014 Document Reviewed: 01/24/2014 Elsevier Interactive Patient Education 2016 Elsevier Inc[employer] Bacterial  Vaginosis Bacterial vaginosis is a vaginal infection that occurs when the normal balance of bacteria in the vagina is disrupted. It results from an overgrowth of certain bacteria. This is the most common vaginal infection in women of childbearing age. Treatment is important to prevent complications, especially in pregnant women, as it can cause a premature delivery. CAUSES  Bacterial vaginosis is caused by an increase in harmful bacteria that are normally present in smaller amounts in the vagina. Several different kinds of bacteria can cause bacterial vaginosis. However, the reason that the condition develops is not fully understood. RISK FACTORS Certain activities or behaviors can put you at an increased risk of developing bacterial vaginosis, including:  Having a new sex partner or multiple sex partners.  Douching.  Using an intrauterine device (IUD) for contraception. Women do not get bacterial vaginosis from toilet seats, bedding, swimming pools, or contact with objects around them. SIGNS AND SYMPTOMS  Some women with bacterial vaginosis have no signs or symptoms. Common symptoms include:  Grey vaginal discharge.  A fishlike odor with discharge, especially after sexual intercourse.  Itching or burning of the vagina and vulva.  Burning or pain with urination. DIAGNOSIS  Your health care provider will take a medical history and examine the vagina for signs of bacterial vaginosis.  A sample of vaginal fluid may be taken. Your health care provider will look at this sample under a microscope to check for bacteria and abnormal cells. A vaginal pH test may also be done.  TREATMENT  Bacterial vaginosis may be treated with antibiotic medicines. These may be given in the form of a pill or a vaginal cream. A second round of antibiotics may be prescribed if the condition comes back after treatment. Because bacterial vaginosis increases your risk for sexually transmitted diseases, getting treated can  help reduce your risk for chlamydia, gonorrhea, HIV, and herpes. HOME CARE INSTRUCTIONS   Only take over-the-counter or prescription medicines as directed by your health care provider.  If antibiotic medicine was prescribed, take it as directed. Make sure you finish it even if you start to feel better.  Tell all sexual partners that you have a vaginal infection. They should see their health care provider and be treated if they have problems, such as a mild rash or itching.  During treatment, it is important that you follow these instructions:  Avoid sexual activity or use condoms correctly.  Do not douche.  Avoid alcohol as directed by your health care provider.  Avoid breastfeeding as directed by your health care provider. SEEK MEDICAL CARE IF:   Your symptoms are not improving after 3 days of treatment.  You have increased discharge or pain.  You have a fever. MAKE SURE YOU:   Understand these instructions.  Will watch your condition.  Will get help right away if you are not doing well or get worse. FOR MORE INFORMATION  Centers for Disease Control and Prevention, Division of STD Prevention: [employer] American Sexual Health Association (ASHA): www.ashastd.org    This information is not intended to replace advice given to you by your health care provider. Make sure you discuss any questions you have with your health care provider.   Document Released: 09/09/2005 Document Revised: 09/30/2014 Document Reviewed: 04/21/2013 Elsevier Interactive Patient Education [employer].

## 2015-12-08 NOTE — Progress Notes (Signed)
Patient ID: Monique Hurst, female   DOB: 01-31-1976, 40 y.o.   MRN: [id number] Presents with abnormal March cycle, bleeding started March 1 light for several days and heavy/regular flow for 4-5, spotting, day 15 heavier flow bright red with clots and continued spotting today. Normal less than 7 day cycles January and February. Increased stress. Sister in [place] had suicide attempt in January. Reports occasional discharge with odor, denies vaginal itching or fever. Having intermittent low abdominal cramping, upset stomach without vomiting. Denies change in bowel or bladder elimination.  Exam: Appears well. External genitalia within normal limits, speculum exam moderate amount of menses type blood in vault, wet prep positive for few clues, TNTC bacteria. Cervical os without visible lesion or polyp. Bimanual no CMT or adnexal tenderness. UPT negative  Bacteria vaginosis DUB  Plan: Provera 10 mg by mouth daily for 10 days, instructed to call if cycle does not stop. MetroGel vaginal cream 1 applicator at bedtime 5, alcohol precautions reviewed. Schedule annual exam for May, if April cycle abnormal will proceed to sonohysterogram with Dr. Phineas Real. Condolences given regarding sister.

## 2015-12-27 ENCOUNTER — Encounter: Payer: BLUE CROSS/BLUE SHIELD | Admitting: Women's Health

## 2016-04-01 ENCOUNTER — Other Ambulatory Visit: Payer: Self-pay | Admitting: *Deleted

## 2016-04-01 MED ORDER — HYDROXYZINE HCL 25 MG PO TABS: 25.0000 mg | ORAL_TABLET | Freq: Three times a day (TID) | ORAL | Status: DC | PRN

## 2016-05-13 ENCOUNTER — Ambulatory Visit (INDEPENDENT_AMBULATORY_CARE_PROVIDER_SITE_OTHER): Payer: BLUE CROSS/BLUE SHIELD | Admitting: Allergy and Immunology

## 2016-05-13 ENCOUNTER — Encounter: Payer: Self-pay | Admitting: Allergy and Immunology

## 2016-05-13 VITALS — BP 130/90 | HR 80 | Resp 16 | Ht 64.57 in | Wt 172.2 lb

## 2016-05-13 DIAGNOSIS — L509 Urticaria, unspecified: Secondary | ICD-10-CM | POA: Diagnosis not present

## 2016-05-13 MED ORDER — HYDROXYZINE HCL 25 MG PO TABS: ORAL_TABLET | ORAL | 11 refills | Status: AC

## 2016-05-13 NOTE — Progress Notes (Signed)
Follow-up Note  Referring Provider: Marco Collie, MD Primary Provider: Marco Collie, MD Date of Office Visit: 05/13/2016  Subjective:   Monique Hurst (DOB: May 12, 1976) is a 40 y.o. female who returns to the [facility] on 05/13/2016 in re-evaluation of the following:  HPI: Monique Hurst returns to this clinic in evaluation of her recurrent urticaria. I've not seen her in this clinic in 1 year.  During the interval she is done wonderful as long she continues to use hydroxyzine. She uses 75 mg in the morning which works quite well for her. She has no adverse effect of using this dose. She has not had to use an EpiPen. If she misses her hydroxyzine she will quickly developed problems with itchy hands and then subsequently urticaria. She is very satisfied with the response she receives with this approach.  When I last saw her in this clinic a year ago she also apparently had an eczematous area involving her neck which was secondary to metal and she has now discontinued metal wear and is doing well and does not need to use a topical steroid.    Medication List      EPIPEN 2-PAK 0.3 mg/0.3 mL Soaj injection Generic drug:  EPINEPHrine Inject 0.3 mg into the muscle once.   hydrOXYzine 25 MG tablet Commonly known as:  ATARAX/VISTARIL Take 1 tablet (25 mg total) by mouth 3 (three) times daily as needed.   ibuprofen 800 MG tablet Commonly known as:  ADVIL,MOTRIN Take 1 tablet (800 mg total) by mouth every 8 (eight) hours as needed.       Past Medical History:  Diagnosis Date  . Allergy    SEASONAL  . Depression   . Urticaria     Past Surgical History:  Procedure Laterality Date  . DILATION AND CURETTAGE OF UTERUS      No Known Allergies  Review of systems negative except as noted in HPI / PMHx or noted below:  Review of Systems  Constitutional: Negative.   HENT: Negative.   Eyes: Negative.   Respiratory: Negative.   Cardiovascular: Negative.     Gastrointestinal: Negative.   Genitourinary: Negative.   Musculoskeletal: Negative.   Skin: Negative.   Neurological: Negative.   Endo/Heme/Allergies: Negative.   Psychiatric/Behavioral: Negative.      Objective:   Vitals:   05/13/16 1716  BP: 130/90  Pulse: 80  Resp: 16   Height: 5' 4.57" (164 cm)  Weight: 172 lb 3.2 oz (78.1 kg)   Physical Exam  Constitutional: She is well-developed, well-nourished, and in no distress.  HENT:  Head: Normocephalic.  Right Ear: Tympanic membrane, external ear and ear canal normal.  Left Ear: Tympanic membrane, external ear and ear canal normal.  Nose: Nose normal. No mucosal edema or rhinorrhea.  Mouth/Throat: Uvula is midline, oropharynx is clear and moist and mucous membranes are normal. No oropharyngeal exudate.  Eyes: Conjunctivae are normal.  Neck: Trachea normal. No tracheal tenderness present. No tracheal deviation present. No thyromegaly present.  Cardiovascular: Normal rate, regular rhythm, S1 normal, S2 normal and normal heart sounds.   No murmur heard. Pulmonary/Chest: Breath sounds normal. No stridor. No respiratory distress. She has no wheezes. She has no rales.  Musculoskeletal: She exhibits no edema.  Lymphadenopathy:       Head (right side): No tonsillar adenopathy present.       Head (left side): No tonsillar adenopathy present.    She has no cervical adenopathy.  Neurological: She is alert. Gait normal.  Skin: No rash noted. She is not diaphoretic. No erythema. Nails show no clubbing.  Psychiatric: Mood and affect normal.    Diagnostics: None   Assessment and Plan:   1. Urticaria     1. Continue hydroxyzine 25 mg - 3 tablets in morning  2. Continue EpiPen if needed  3. Obtain fall flu vaccine  4. Return to clinic in 1 year or earlier if problem  Monique Hurst has done very well and I will refill her hydroxyzine and she can follow-up in this clinic in 1 year or earlier if problem. I also informed Monique Hurst that she  can have her primary care physician refill these medications if it was more convenient for her to do so.  Monique Katz, MD [place]

## 2016-05-13 NOTE — Patient Instructions (Addendum)
  1. Continue hydroxyzine 25 mg - 3 tablets in morning  2. Continue EpiPen if needed  3. Obtain fall flu vaccine  4. Return to clinic in 1 year or earlier if problem

## 2017-04-14 ENCOUNTER — Other Ambulatory Visit: Payer: Self-pay | Admitting: Orthopedic Surgery

## 2017-04-14 DIAGNOSIS — M5416 Radiculopathy, lumbar region: Secondary | ICD-10-CM

## 2017-04-17 ENCOUNTER — Ambulatory Visit
Admission: RE | Admit: 2017-04-17 | Discharge: 2017-04-17 | Disposition: A | Discharge status: Home / Self Care | Payer: BLUE CROSS/BLUE SHIELD | Source: Ambulatory Visit | Attending: Orthopedic Surgery | Admitting: Orthopedic Surgery

## 2017-04-17 DIAGNOSIS — M5416 Radiculopathy, lumbar region: Secondary | ICD-10-CM

## 2017-04-17 MED ORDER — METHYLPREDNISOLONE ACETATE 40 MG/ML INJ SUSP (RADIOLOG
120.0000 mg | Freq: Once | INTRAMUSCULAR | Status: AC
  Administered 2017-04-17: 120 mg via EPIDURAL

## 2017-04-17 MED ORDER — IOPAMIDOL (ISOVUE-M 200) INJECTION 41%
1.0000 mL | Freq: Once | INTRAMUSCULAR | Status: AC
  Administered 2017-04-17: 1 mL via EPIDURAL

## 2017-04-17 NOTE — Discharge Instructions (Signed)

## 2017-05-08 ENCOUNTER — Other Ambulatory Visit: Payer: Self-pay | Admitting: Orthopedic Surgery

## 2017-05-08 DIAGNOSIS — G8929 Other chronic pain: Secondary | ICD-10-CM

## 2017-05-08 DIAGNOSIS — M545 Low back pain: Principal | ICD-10-CM

## 2017-05-16 ENCOUNTER — Ambulatory Visit
Admission: RE | Admit: 2017-05-16 | Discharge: 2017-05-16 | Disposition: A | Discharge status: Home / Self Care | Payer: BLUE CROSS/BLUE SHIELD | Source: Ambulatory Visit | Attending: Orthopedic Surgery | Admitting: Orthopedic Surgery

## 2017-05-16 DIAGNOSIS — M545 Low back pain: Principal | ICD-10-CM

## 2017-05-16 DIAGNOSIS — G8929 Other chronic pain: Secondary | ICD-10-CM

## 2017-05-16 MED ORDER — IOPAMIDOL (ISOVUE-M 200) INJECTION 41%
1.0000 mL | Freq: Once | INTRAMUSCULAR | Status: AC
  Administered 2017-05-16: 1 mL via EPIDURAL

## 2017-05-16 MED ORDER — METHYLPREDNISOLONE ACETATE 40 MG/ML INJ SUSP (RADIOLOG
120.0000 mg | Freq: Once | INTRAMUSCULAR | Status: AC
  Administered 2017-05-16: 120 mg via EPIDURAL

## 2017-06-09 ENCOUNTER — Other Ambulatory Visit: Payer: Self-pay | Admitting: Orthopedic Surgery

## 2017-06-09 DIAGNOSIS — M5416 Radiculopathy, lumbar region: Secondary | ICD-10-CM

## 2017-06-19 ENCOUNTER — Ambulatory Visit
Admission: RE | Admit: 2017-06-19 | Discharge: 2017-06-19 | Disposition: A | Discharge status: Home / Self Care | Payer: BLUE CROSS/BLUE SHIELD | Source: Ambulatory Visit | Attending: Orthopedic Surgery | Admitting: Orthopedic Surgery

## 2017-06-19 DIAGNOSIS — M5416 Radiculopathy, lumbar region: Secondary | ICD-10-CM

## 2017-06-19 IMAGING — XA DG EPIDURAL/NERVE ROOT
2 series · 2 of 2 positions shown · non-contrast
Comparison: none

CLINICAL DATA: Lumbosacral spondylosis without myelopathy with
radiculopathy. Substantial improvement following the first 2
transforaminal epidural injections. Residual pain is predominantly
located in the left low back and foot, with much less pain
throughout the remainder of the leg.

[Series 1: ortho adipose · 1 of 1 slices shown (1 of 2)]
[im 1/1]
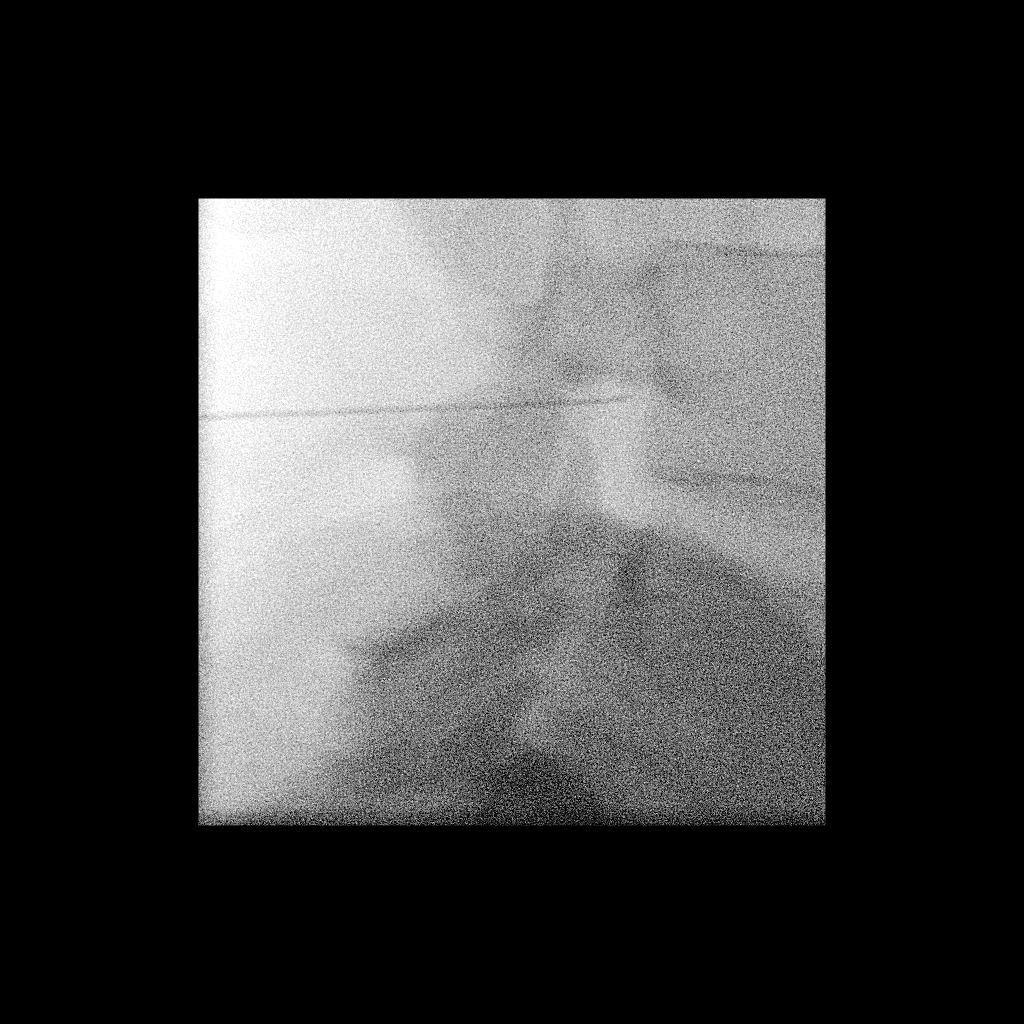

[Series 2: ortho adipose · 1 of 1 slices shown (2 of 2)]
[im 1/1]
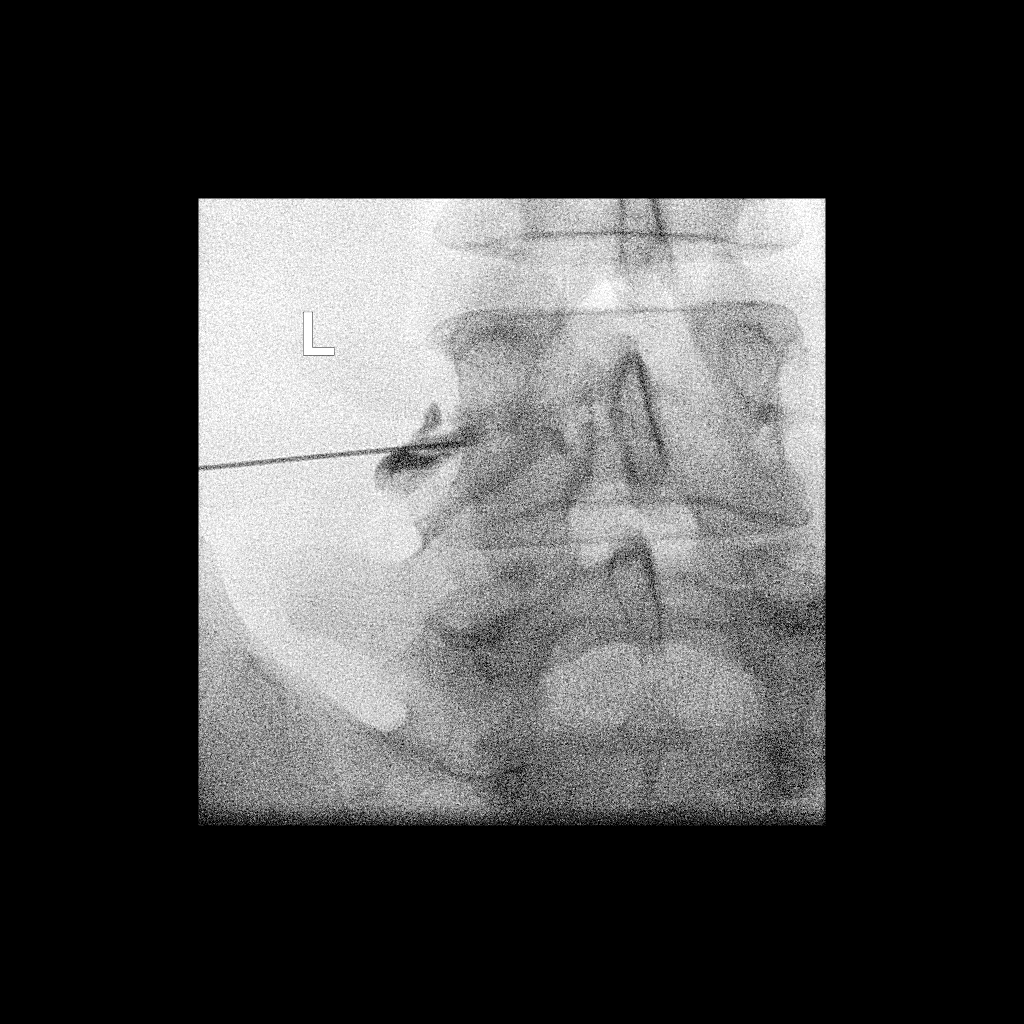

[2 of 2 positions shown; findings below may reference images not displayed]

EXAM:
EPIDURAL/NERVE ROOT

FLUOROSCOPY TIME:  Radiation Exposure Index (as provided by the
fluoroscopic device): 42.40 microGray*m^2

Fluoroscopy Time (in minutes and seconds):  17 seconds

PROCEDURE:
The procedure, risks, benefits, and alternatives were explained to
the patient. Questions regarding the procedure were encouraged and
answered. The patient understands and consents to the procedure.

LEFT L4 NERVE ROOT BLOCK AND TRANSFORAMINAL EPIDURAL: A posterior
oblique approach was taken to the intervertebral foramen on the left
at L4-5 using a curved 5 inch 22 gauge spinal needle. Injection of
Isovue-M 200 outlined the left L4 nerve root and showed good
epidural spread. No vascular opacification is seen. 120 mg of
Depo-Medrol mixed with 2 mL of 1% lidocaine were instilled. The
procedure was well-tolerated, and the patient was discharged thirty
minutes following the injection in good condition.

COMPLICATIONS:
None
IMPRESSION: Technically successful injection consisting of a left L4 nerve root
block and transforaminal epidural.

## 2017-06-19 MED ORDER — IOPAMIDOL (ISOVUE-M 200) INJECTION 41%
1.0000 mL | Freq: Once | INTRAMUSCULAR | Status: AC
  Administered 2017-06-19: 1 mL via EPIDURAL

## 2017-06-19 MED ORDER — METHYLPREDNISOLONE ACETATE 40 MG/ML INJ SUSP (RADIOLOG
120.0000 mg | Freq: Once | INTRAMUSCULAR | Status: AC
  Administered 2017-06-19: 120 mg via EPIDURAL

## 2017-07-01 ENCOUNTER — Ambulatory Visit: Payer: BLUE CROSS/BLUE SHIELD | Admitting: Women's Health

## 2017-08-05 ENCOUNTER — Encounter: Payer: BLUE CROSS/BLUE SHIELD | Admitting: Women's Health

## 2017-09-25 ENCOUNTER — Other Ambulatory Visit: Payer: Self-pay | Admitting: Gynecology

## 2017-09-25 ENCOUNTER — Ambulatory Visit (INDEPENDENT_AMBULATORY_CARE_PROVIDER_SITE_OTHER): Payer: BLUE CROSS/BLUE SHIELD | Admitting: Women's Health

## 2017-09-25 ENCOUNTER — Other Ambulatory Visit: Payer: Self-pay | Admitting: Women's Health

## 2017-09-25 ENCOUNTER — Encounter: Payer: Self-pay | Admitting: Women's Health

## 2017-09-25 VITALS — BP 122/80 | Ht 65.0 in | Wt 189.0 lb

## 2017-09-25 DIAGNOSIS — Z13220 Encounter for screening for lipoid disorders: Secondary | ICD-10-CM

## 2017-09-25 DIAGNOSIS — N926 Irregular menstruation, unspecified: Secondary | ICD-10-CM

## 2017-09-25 DIAGNOSIS — Z1231 Encounter for screening mammogram for malignant neoplasm of breast: Secondary | ICD-10-CM

## 2017-09-25 DIAGNOSIS — Z01419 Encounter for gynecological examination (general) (routine) without abnormal findings: Secondary | ICD-10-CM | POA: Diagnosis not present

## 2017-09-25 DIAGNOSIS — N939 Abnormal uterine and vaginal bleeding, unspecified: Secondary | ICD-10-CM

## 2017-09-25 NOTE — Patient Instructions (Addendum)
Breast center [phone] Carbohydrate Counting for Diabetes Mellitus, Adult Carbohydrate counting is a method for keeping track of how many carbohydrates you eat. Eating carbohydrates naturally increases the amount of sugar (glucose) in the blood. Counting how many carbohydrates you eat helps keep your blood glucose within normal limits, which helps you manage your diabetes (diabetes mellitus). It is important to know how many carbohydrates you can safely have in each meal. This is different for every person. A diet and nutrition specialist (registered dietitian) can help you make a meal plan and calculate how many carbohydrates you should have at each meal and snack. Carbohydrates are found in the following foods:  Grains, such as breads and cereals.  Dried beans and soy products.  Starchy vegetables, such as potatoes, peas, and corn.  Fruit and fruit juices.  Milk and yogurt.  Sweets and snack foods, such as cake, cookies, candy, chips, and soft drinks.  How do I count carbohydrates? There are two ways to count carbohydrates in food. You can use either of the methods or a combination of both. Reading "Nutrition Facts" on packaged food The "Nutrition Facts" list is included on the labels of almost all packaged foods and beverages in the [place] It includes:  The serving size.  Information about nutrients in each serving, including the grams (g) of carbohydrate per serving.  To use the "Nutrition Facts":  Decide how many servings you will have.  Multiply the number of servings by the number of carbohydrates per serving.  The resulting number is the total amount of carbohydrates that you will be having.  Learning standard serving sizes of other foods When you eat foods containing carbohydrates that are not packaged or do not include "Nutrition Facts" on the label, you need to measure the servings in order to count the amount of carbohydrates:  Measure the foods that you will eat with  a food scale or measuring cup, if needed.  Decide how many standard-size servings you will eat.  Multiply the number of servings by 15. Most carbohydrate-rich foods have about 15 g of carbohydrates per serving. ? For example, if you eat 8 oz (170 g) of strawberries, you will have eaten 2 servings and 30 g of carbohydrates (2 servings x 15 g = 30 g).  For foods that have more than one food mixed, such as soups and casseroles, you must count the carbohydrates in each food that is included.  The following list contains standard serving sizes of common carbohydrate-rich foods. Each of these servings has about 15 g of carbohydrates:   hamburger bun or  English muffin.   oz (15 mL) syrup.   oz (14 g) jelly.  1 slice of bread.  1 six-inch tortilla.  3 oz (85 g) cooked rice or pasta.  4 oz (113 g) cooked dried beans.  4 oz (113 g) starchy vegetable, such as peas, corn, or potatoes.  4 oz (113 g) hot cereal.  4 oz (113 g) mashed potatoes or  of a large baked potato.  4 oz (113 g) canned or frozen fruit.  4 oz (120 mL) fruit juice.  4-6 crackers.  6 chicken nuggets.  6 oz (170 g) unsweetened dry cereal.  6 oz (170 g) plain fat-free yogurt or yogurt sweetened with artificial sweeteners.  8 oz (240 mL) milk.  8 oz (170 g) fresh fruit or one small piece of fruit.  24 oz (680 g) popped popcorn.  Example of carbohydrate counting Sample meal  3 oz (85  g) chicken breast.  6 oz (170 g) brown rice.  4 oz (113 g) corn.  8 oz (240 mL) milk.  8 oz (170 g) strawberries with sugar-free whipped topping. Carbohydrate calculation 1. Identify the foods that contain carbohydrates: ? Rice. ? Corn. ? Milk. ? Strawberries. 2. Calculate how many servings you have of each food: ? 2 servings rice. ? 1 serving corn. ? 1 serving milk. ? 1 serving strawberries. 3. Multiply each number of servings by 15 g: ? 2 servings rice x 15 g = 30 g. ? 1 serving corn x 15 g = 15 g. ? 1  serving milk x 15 g = 15 g. ? 1 serving strawberries x 15 g = 15 g. 4. Add together all of the amounts to find the total grams of carbohydrates eaten: ? 30 g + 15 g + 15 g + 15 g = 75 g of carbohydrates total. This information is not intended to replace advice given to you by your health care provider. Make sure you discuss any questions you have with your health care provider. Document Released: 09/09/2005 Document Revised: 03/29/2016 Document Reviewed: 02/21/2016 Elsevier Interactive Patient Education  2018 [place].  Abnormal Uterine Bleeding Abnormal uterine bleeding means bleeding more than usual from your uterus. It can include:  Bleeding between periods.  Bleeding after sex.  Bleeding that is heavier than normal.  Periods that last longer than usual.  Bleeding after you have stopped having your period (menopause).  There are many problems that may cause this. You should see a doctor for any kind of bleeding that is not normal. Treatment depends on the cause of the bleeding. Follow these instructions at home:  Watch your condition for any changes.  Do not use tampons, douche, or have sex, if your doctor tells you not to.  Change your pads often.  Get regular well-woman exams. Make sure they include a pelvic exam and cervical cancer screening.  Keep all follow-up visits as told by your doctor. This is important. Contact a doctor if:  The bleeding lasts more than one week.  You feel dizzy at times.  You feel like you are going to throw up (nauseous).  You throw up. Get help right away if:  You pass out.  You have to change pads every hour.  You have belly (abdominal) pain.  You have a fever.  You get sweaty.  You get weak.  You passing large blood clots from your vagina. Summary  Abnormal uterine bleeding means bleeding more than usual from your uterus.  There are many problems that may cause this. You should see a doctor for any kind of bleeding  that is not normal.  Treatment depends on the cause of the bleeding. This information is not intended to replace advice given to you by your health care provider. Make sure you discuss any questions you have with your health care provider. Document Released: 07/07/2009 Document Revised: 09/03/2016 Document Reviewed: 09/03/2016 Elsevier Interactive Patient Education  2017 [employer].

## 2017-09-25 NOTE — Progress Notes (Signed)
Monique Hurst 10-Oct-1975 [id number]    History:    Presents for annual exam.  Cycles monthly alternate between light and heavy flow, lasting greater than one week with occasional spotting between cycles for the past year. 2016  2 small fibroids 31 mm and 32 mm. . Rare intercourse, poor relationship with husband. Normal Pap history, has not had a screening mammogram. Depression currently on no medication. Negative colonoscopy 2016 questionable rectal bleeding. Had a back injury one year ago and is now recovering and back to running.  Past medical history, past surgical history, family history and social history were all reviewed and documented in the EPIC chart. Paralegal. Sister in [place] suicide attempt in [id number]. 43 year old son senior in high school recently received a full academic scholarship to [employer].  ROS:  A ROS was performed and pertinent positives and negatives are included.  Exam:  Vitals:   09/25/17 1022  BP: 122/80  Weight: 189 lb (85.7 kg)  Height: 5\' 5"  (1.651 m)   Body mass index is 31.45 kg/m.   General appearance:  Normal Thyroid:  Symmetrical, normal in size, without palpable masses or nodularity. Respiratory  Auscultation:  Clear without wheezing or rhonchi Cardiovascular  Auscultation:  Regular rate, without rubs, murmurs or gallops  Edema/varicosities:  Not grossly evident Abdominal  Soft,nontender, without masses, guarding or rebound.  Liver/spleen:  No organomegaly noted  Hernia:  None appreciated  Skin  Inspection:  Grossly normal   Breasts: Examined lying and sitting.     Right: Without masses, retractions, discharge or axillary adenopathy.     Left: Without masses, retractions, discharge or axillary adenopathy. Gentitourinary   Inguinal/mons:  Normal without inguinal adenopathy  External genitalia:  Normal  BUS/Urethra/Skene's glands:  Normal  Vagina:  Normal  Cervix:  Normal  Uterus:  normal in size, shape and contour.  Midline and  mobile  Adnexa/parametria:     Rt: Without masses or tenderness.   Lt: Without masses or tenderness.  Anus and perineum: Normal  Digital rectal exam: Normal sphincter tone without palpated masses or tenderness  Assessment/Plan:  42 y.o. M WF G2 P1 for annual exam.    Monthly cycle with spotting and flow lasting greater than 7 days Depression  Plan: Strongly encouraged counseling, self-care , leisure activities encouraged. Encouraged date nights, marriage counseling, states will think about. SBE's, annual screening mammogram, breast center information given instructed to schedule. Continue regular exercise, running. Vitamin D 1000 daily encouraged. Declines contraception condoms or withdrawal. Pap with HR HPV typing, new screening guidelines reviewed. Instructed to schedule sonohysterogram with Dr. Phineas Real.    Huel Cote [facility], 1:14 PM 09/25/2017

## 2017-09-26 LAB — PAP, TP IMAGING W/ HPV RNA, RFLX HPV TYPE 16,18/45: HPV DNA HIGH RISK: NOT DETECTED

## 2017-09-30 ENCOUNTER — Ambulatory Visit: Payer: BLUE CROSS/BLUE SHIELD | Admitting: Gynecology

## 2017-09-30 ENCOUNTER — Other Ambulatory Visit: Payer: BLUE CROSS/BLUE SHIELD

## 2017-10-20 ENCOUNTER — Ambulatory Visit
Admission: RE | Admit: 2017-10-20 | Discharge: 2017-10-20 | Disposition: A | Discharge status: Home / Self Care | Payer: BLUE CROSS/BLUE SHIELD | Source: Ambulatory Visit | Attending: Women's Health | Admitting: Women's Health

## 2017-10-20 DIAGNOSIS — Z1231 Encounter for screening mammogram for malignant neoplasm of breast: Secondary | ICD-10-CM

## 2017-10-22 ENCOUNTER — Other Ambulatory Visit: Payer: Self-pay | Admitting: Women's Health

## 2017-10-22 DIAGNOSIS — R928 Other abnormal and inconclusive findings on diagnostic imaging of breast: Secondary | ICD-10-CM

## 2017-11-04 ENCOUNTER — Ambulatory Visit
Admission: RE | Admit: 2017-11-04 | Discharge: 2017-11-04 | Disposition: A | Discharge status: Home / Self Care | Payer: BLUE CROSS/BLUE SHIELD | Source: Ambulatory Visit | Attending: Women's Health | Admitting: Women's Health

## 2017-11-04 ENCOUNTER — Other Ambulatory Visit: Payer: Self-pay | Admitting: Women's Health

## 2017-11-04 DIAGNOSIS — N6320 Unspecified lump in the left breast, unspecified quadrant: Secondary | ICD-10-CM

## 2017-11-04 DIAGNOSIS — R928 Other abnormal and inconclusive findings on diagnostic imaging of breast: Secondary | ICD-10-CM

## 2017-11-07 ENCOUNTER — Ambulatory Visit
Admission: RE | Admit: 2017-11-07 | Discharge: 2017-11-07 | Disposition: A | Discharge status: Home / Self Care | Payer: BLUE CROSS/BLUE SHIELD | Source: Ambulatory Visit | Attending: Women's Health | Admitting: Women's Health

## 2017-11-07 DIAGNOSIS — N6320 Unspecified lump in the left breast, unspecified quadrant: Secondary | ICD-10-CM

## 2017-11-11 ENCOUNTER — Encounter: Payer: Self-pay | Admitting: Women's Health

## 2018-04-21 ENCOUNTER — Other Ambulatory Visit: Payer: Self-pay | Admitting: Allergy and Immunology

## 2018-05-12 ENCOUNTER — Other Ambulatory Visit: Payer: Self-pay | Admitting: Allergy and Immunology

## 2018-09-28 ENCOUNTER — Encounter: Payer: BLUE CROSS/BLUE SHIELD | Admitting: Women's Health

## 2019-06-15 ENCOUNTER — Encounter: Payer: Self-pay | Admitting: Gynecology
